# Patient Record
Sex: Female | Born: 1945 | Race: White | Hispanic: No | Marital: Married | Smoking: Never smoker
Health system: Southern US, Community
[De-identification: ages and names within clinical notes are randomized; demographics above are authoritative.]

---

## 2021-11-22 ENCOUNTER — Other Ambulatory Visit: Payer: Self-pay

## 2021-11-22 ENCOUNTER — Emergency Department (HOSPITAL_BASED_OUTPATIENT_CLINIC_OR_DEPARTMENT_OTHER)
Admission: EM | Admit: 2021-11-22 | Discharge: 2021-11-22 | Disposition: A | Discharge status: Home / Self Care | Payer: Medicare (Managed Care) | Attending: Emergency Medicine | Admitting: Emergency Medicine

## 2021-11-22 ENCOUNTER — Encounter (HOSPITAL_BASED_OUTPATIENT_CLINIC_OR_DEPARTMENT_OTHER): Payer: Self-pay

## 2021-11-22 ENCOUNTER — Emergency Department (HOSPITAL_BASED_OUTPATIENT_CLINIC_OR_DEPARTMENT_OTHER): Payer: Medicare (Managed Care) | Admitting: Radiology

## 2021-11-22 DIAGNOSIS — I4891 Unspecified atrial fibrillation: Secondary | ICD-10-CM | POA: Insufficient documentation

## 2021-11-22 DIAGNOSIS — R7989 Other specified abnormal findings of blood chemistry: Secondary | ICD-10-CM | POA: Diagnosis not present

## 2021-11-22 DIAGNOSIS — I10 Essential (primary) hypertension: Secondary | ICD-10-CM | POA: Diagnosis not present

## 2021-11-22 DIAGNOSIS — Z79899 Other long term (current) drug therapy: Secondary | ICD-10-CM | POA: Insufficient documentation

## 2021-11-22 DIAGNOSIS — Z7901 Long term (current) use of anticoagulants: Secondary | ICD-10-CM | POA: Insufficient documentation

## 2021-11-22 DIAGNOSIS — E039 Hypothyroidism, unspecified: Secondary | ICD-10-CM | POA: Insufficient documentation

## 2021-11-22 DIAGNOSIS — R002 Palpitations: Secondary | ICD-10-CM | POA: Diagnosis present

## 2021-11-22 LAB — BASIC METABOLIC PANEL
Anion gap: 10 (ref 5–15)
BUN: 16 mg/dL (ref 8–23)
CO2: 26 mmol/L (ref 22–32)
Calcium: 10.3 mg/dL (ref 8.9–10.3)
Chloride: 101 mmol/L (ref 98–111)
Creatinine, Ser: 1.09 mg/dL — ABNORMAL HIGH (ref 0.44–1.00)
GFR, Estimated: 53 mL/min — ABNORMAL LOW (ref >60)
Glucose, Bld: 108 mg/dL — ABNORMAL HIGH (ref 70–99)
Potassium: 3.7 mmol/L (ref 3.5–5.1)
Sodium: 137 mmol/L (ref 135–145)

## 2021-11-22 LAB — BRAIN NATRIURETIC PEPTIDE: B Natriuretic Peptide: 228.5 pg/mL — ABNORMAL HIGH (ref 0.0–100.0)

## 2021-11-22 LAB — TSH: TSH: 1.338 u[IU]/mL (ref 0.350–4.500)

## 2021-11-22 LAB — TROPONIN I (HIGH SENSITIVITY): Troponin I (High Sensitivity): 3 ng/L (ref <18)

## 2021-11-22 LAB — CBC
HCT: 41.3 % (ref 36.0–46.0)
Hemoglobin: 13.6 g/dL (ref 12.0–15.0)
MCH: 26.8 pg (ref 26.0–34.0)
MCHC: 32.9 g/dL (ref 30.0–36.0)
MCV: 81.5 fL (ref 80.0–100.0)
Platelets: 285 10*3/uL (ref 150–400)
RBC: 5.07 MIL/uL (ref 3.87–5.11)
RDW: 14.5 % (ref 11.5–15.5)
WBC: 6.5 10*3/uL (ref 4.0–10.5)
nRBC: 0 % (ref 0.0–0.2)

## 2021-11-22 LAB — T4, FREE: Free T4: 1.28 ng/dL — ABNORMAL HIGH (ref 0.61–1.12)

## 2021-11-22 LAB — MAGNESIUM: Magnesium: 2 mg/dL (ref 1.7–2.4)

## 2021-11-22 LAB — PROTIME-INR
INR: 1 (ref 0.8–1.2)
Prothrombin Time: 13 seconds (ref 11.4–15.2)

## 2021-11-22 MED ORDER — APIXABAN 5 MG PO TABS: 5.0000 mg | ORAL_TABLET | Freq: Two times a day (BID) | ORAL | 1 refills | Status: AC

## 2021-11-22 MED ORDER — APIXABAN (ELIQUIS) EDUCATION KIT FOR DVT/PE PATIENTS: PACK | Freq: Once | Status: AC

## 2021-11-22 MED ORDER — APIXABAN 2.5 MG PO TABS
5.0000 mg | ORAL_TABLET | Freq: Two times a day (BID) | ORAL | Status: DC
  Administered 2021-11-22: 5 mg via ORAL
  Filled 2021-11-22: qty 2

## 2021-11-22 NOTE — ED Provider Notes (Signed)
[facility] EMERGENCY DEPT Provider Note   CSN: [id number] Arrival date & time: 11/22/21  1537     History  Chief Complaint  Patient presents with   Irregular Heart Beat    Tanya Michael is a 76 y.o. female.  HPI  Patient presents with abnormal rhythm.  Patient states that her apple watch alerted her that she was in atrial fibrillation, no history of it.  States she does intermittently have palpitations and feel dizzy, she has had 3 episodes of this in the prior few months.  Denies any syncope, no history of MI or strokes.  Denies any chest pain or feeling short of breath.  Patient usually lives in [place], she and her husband spend the winter in [place] visiting family.    Patient with history of hypertension, hypothyroid and hyperlipidemia.   No history of intracranial bleed, TIA, stroke, major bleeding, predisposition to bleeding, renal disease, liver disease.  No history of epidermal or subdural hematoma.  No history of PE or MI.  Home Medications Prior to Admission medications   Medication Sig Start Date End Date Taking? Authorizing Provider  amLODipine (NORVASC) 10 MG tablet Take 10 mg by mouth daily.   Yes [provider]  apixaban (ELIQUIS) 5 MG TABS tablet Take 1 tablet (5 mg total) by mouth 2 (two) times daily. 11/22/21 01/21/22 Yes Sherrill Raring, PA-C  atorvastatin (LIPITOR) 20 MG tablet Take 20 mg by mouth daily.   Yes [provider]  buPROPion (WELLBUTRIN XL) 300 MG 24 hr tablet Take 300 mg by mouth daily.   Yes [provider]  DULoxetine (CYMBALTA) 30 MG capsule Take 30 mg by mouth daily. Takes 3 tablets for total 90 mg   Yes [provider]  DULoxetine HCl 30 MG CSDR Take by mouth daily.   Yes [provider]  levothyroxine (SYNTHROID) 125 MCG tablet Take 125 mcg by mouth daily before breakfast.   Yes [provider]  losartan-hydrochlorothiazide (HYZAAR) 100-25 MG tablet Take 1 tablet by mouth  daily.   Yes [provider]  metoprolol succinate (TOPROL-XL) 25 MG 24 hr tablet Take 25 mg by mouth daily.   Yes [provider]  traZODone (DESYREL) 100 MG tablet Take 100 mg by mouth at bedtime.   Yes [provider]      Allergies    Phenobarbital and Sulfa antibiotics    Review of Systems   Review of Systems  Constitutional:  Negative for fever.  Cardiovascular:  Positive for palpitations.   Physical Exam Updated Vital Signs BP (!) 153/82 (BP Location: Left Arm)    Pulse 81    Temp 97.7 F (36.5 C) (Oral)    Resp 16    Ht $R'5\' 2"'tK$  (1.575 m)    Wt 78.9 kg    SpO2 96%    BMI 31.83 kg/m  Physical Exam Vitals and nursing note reviewed. Exam conducted with a chaperone present.  Constitutional:      Appearance: Normal appearance.  HENT:     Head: Normocephalic and atraumatic.  Eyes:     General: No scleral icterus.       Right eye: No discharge.        Left eye: No discharge.     Extraocular Movements: Extraocular movements intact.     Pupils: Pupils are equal, round, and reactive to light.  Cardiovascular:     Rate and Rhythm: Tachycardia present. Rhythm irregular.     Pulses: Normal pulses.  Heart sounds: Normal heart sounds. No murmur heard.   No friction rub. No gallop.  Pulmonary:     Effort: Pulmonary effort is normal. No respiratory distress.     Breath sounds: Normal breath sounds.  Abdominal:     General: Abdomen is flat. Bowel sounds are normal. There is no distension.     Palpations: Abdomen is soft.     Tenderness: There is no abdominal tenderness.  Skin:    General: Skin is warm and dry.     Coloration: Skin is not jaundiced.  Neurological:     Mental Status: She is alert. Mental status is at baseline.     Coordination: Coordination normal.    ED Results / Procedures / Treatments   Labs (all labs ordered are listed, but only abnormal results are displayed) Labs Reviewed  BASIC METABOLIC PANEL - Abnormal; Notable for the  following components:      Result Value   Glucose, Bld 108 (*)    Creatinine, Ser 1.09 (*)    GFR, Estimated 53 (*)    All other components within normal limits  BRAIN NATRIURETIC PEPTIDE - Abnormal; Notable for the following components:   B Natriuretic Peptide 228.5 (*)    All other components within normal limits  CBC  PROTIME-INR  MAGNESIUM  TSH  T4, FREE  TROPONIN I (HIGH SENSITIVITY)    EKG EKG Interpretation  Date/Time:  Thursday November 22 2021 15:59:46 EST Ventricular Rate:  95 PR Interval:    QRS Duration: 82 QT Interval:  372 QTC Calculation: 467 R Axis:   42 Text Interpretation: Atrial fibrillation Nonspecific ST abnormality Abnormal ECG No previous ECGs available Confirmed by Gareth Morgan [phone]) on 11/22/2021 5:18:38 PM  Radiology DG Chest 2 View  Result Date: 11/22/2021 CLINICAL DATA:  Atrial fibrillation in a 76 year old female. EXAM: CHEST - 2 VIEW COMPARISON:  None FINDINGS: The heart size and mediastinal contours are within normal limits. Both lungs are clear. The visualized skeletal structures are unremarkable. IMPRESSION: No active cardiopulmonary disease. Electronically Signed   By: Zetta Bills M.D.   On: 11/22/2021 16:17    Procedures Procedures    Medications Ordered in ED Medications  apixaban (ELIQUIS) tablet 5 mg (5 mg Oral Given 11/22/21 2025)  apixaban Arne Cleveland) Education Kit for DVT/PE patients ( Does not apply Given 11/22/21 2012)    ED Course/ Medical Decision Making/ A&P                           Medical Decision Making Amount and/or Complexity of Data Reviewed Labs: ordered. Radiology: ordered.  Risk Prescription drug management.   This patient presents to the ED for concern of palpitations/irregular heart rate, this involves an extensive number of treatment options, and is a complaint that carries with it a high risk of complications and morbidity.  The differential diagnosis includes arrhythmia, electrolyte imbalance,  thyroid disorder, ACS, CHF, other   Co morbidities that complicate the patient evaluation: HTN, hypothyroid, HLD.    Additional history obtained: -Additional history obtained from spouse at bedside -External records from outside source obtained and reviewed including: Chart review including previous notes, labs, imaging, consultation notes   Lab Tests: -I ordered, reviewed, and interpreted labs.  The pertinent results include:  Creatinine mildly elevated 1.09.  Troponin within normal limit, magnesium, PT/INR, within normal limit.  BNP slightly elevated but not excessively shows to be concerning for CHF.  No leukocytosis, no anemia.  TSH and T4  pending.   EKG -Atrial fibrillation, rate controlled likely secondary to her being on metoprolol for HTN   Imaging Studies ordered: -I ordered imaging studies including chest x-ray -I independently visualized and interpreted imaging which showed normal cardiac silhouette without any evidence of CHF, pneumonia, pleural effusion -I agree with the radiologist interpretation   Medicines ordered and prescription drug management: -I ordered medication including Eliquis for AF  -Reevaluation of the patient after these medicines showed that the patient stayed the same -I have reviewed the patients home medicines and have made adjustments as needed   Consultations Obtained: I requested consultation with the pharmacy,  and discussed lab and imaging findings as well as pertinent plan - they recommend: 5 mg of Eliquis twice daily.    ED Course: Patient is a 76 year old female presenting with atrial fibrillation.  She is relatively asymptomatic with intermittent palpitations over the last few months.  Not have any chest pain or shortness of breath, not in aVR.  Work-up does not show emergent etiology for the A. fib/electrolyte derangement, new CHF, MI.  Patient remains asymptomatic, no hypoxia.    ASSESSMENT AND PLAN: Paroxysmal Atrial Fibrillation  (ICD10:  I48.0) The patient's CHA2DS2-VASc score is 3 , indicating a  3.2% annual risk of stroke.   CHA2DS2-VASc Score =   3 (age, sex, HTN hx)     Signed,  Lahoma Crocker, PA-C    11/22/2021 8:48 PM     HAS BLED 2.   Critical Interventions: Initiation of anticoagulation   Cardiac Monitoring: The patient was maintained on a cardiac monitor.  I personally viewed and interpreted the cardiac monitored which showed an underlying rhythm of: Atrial fibrillation   Reevaluation: After the interventions noted above, I reevaluated the patient and found that they have :stayed the same   Dispostion: Patient initiated on anticoagulation and atrial fibrillation.  Questions answered, risks of being on anticoagulation discussed and questions answered.  Patient will follow up AF clinic.   Discussed HPI, physical exam and plan of care for this patient with attending Dr. Billy Fischer. The attending physician evaluated this patient as part of a shared visit and agrees with plan of care.           Final Clinical Impression(s) / ED Diagnoses Final diagnoses:  Atrial fibrillation, unspecified type [facility])    Rx / DC Orders ED Discharge Orders          Ordered    apixaban (ELIQUIS) 5 MG TABS tablet  2 times daily        11/22/21 2012              Sherrill Raring, Hershal Coria 11/22/21 2049    Gareth Morgan, MD 11/24/21 1245

## 2021-11-22 NOTE — ED Triage Notes (Signed)
Patient here POV from Home with Atrial Fibrillation.  Patient states her Apple Watch informed her she was in A. Fib. Patient went to UC and was instructed to come to ED for Evaluation.  Patient is Asymptomatic besides some Mild Dizziness.   Patient has not been diagnosed or informed of any Heart Irregularities.  NAD Noted during Triage. A&Ox4. GCS 15. Ambulatory.

## 2021-11-22 NOTE — Discharge Instructions (Addendum)
Take 5 mg of Eliquis twice daily. You will need to follow-up with the A. fib clinic, please call to set up an appointment.

## 2021-11-28 ENCOUNTER — Encounter (HOSPITAL_COMMUNITY): Payer: Self-pay | Admitting: Nurse Practitioner

## 2021-11-28 ENCOUNTER — Ambulatory Visit (HOSPITAL_COMMUNITY)
Admission: RE | Admit: 2021-11-28 | Discharge: 2021-11-28 | Disposition: A | Discharge status: Home / Self Care | Payer: No Typology Code available for payment source | Source: Ambulatory Visit | Attending: Nurse Practitioner | Admitting: Nurse Practitioner

## 2021-11-28 ENCOUNTER — Other Ambulatory Visit: Payer: Self-pay

## 2021-11-28 VITALS — BP 150/68 | HR 51 | Ht 62.0 in | Wt 178.0 lb

## 2021-11-28 DIAGNOSIS — I480 Paroxysmal atrial fibrillation: Secondary | ICD-10-CM | POA: Diagnosis present

## 2021-11-28 DIAGNOSIS — Z7901 Long term (current) use of anticoagulants: Secondary | ICD-10-CM | POA: Insufficient documentation

## 2021-11-28 DIAGNOSIS — Z79899 Other long term (current) drug therapy: Secondary | ICD-10-CM | POA: Insufficient documentation

## 2021-11-28 DIAGNOSIS — D6869 Other thrombophilia: Secondary | ICD-10-CM

## 2021-11-28 DIAGNOSIS — R0683 Snoring: Secondary | ICD-10-CM | POA: Insufficient documentation

## 2021-11-28 DIAGNOSIS — I10 Essential (primary) hypertension: Secondary | ICD-10-CM | POA: Insufficient documentation

## 2021-11-28 MED ORDER — DILTIAZEM HCL 30 MG PO TABS: ORAL_TABLET | ORAL | 1 refills | Status: AC

## 2021-11-28 NOTE — Progress Notes (Signed)
Primary Care Physician: Pcp, No Referring Physician:   [name] is a 76 y.o. female with a h/o HTN, previous dx of sleep apnea,  that presented to [facility] ER for palpations that was noted on her apple watch. EKG confirmed new onset afib. She was rate controlled as he was already on metoprolol for HTN.  She was started on eliquis for CHA2DS2VASc  of at least 3. She is from [place] and spends the winters here each year. She is planning go back to her home mid March.   Today, she denies symptoms of palpitations, chest pain, shortness of breath, orthopnea, PND, lower extremity edema, dizziness, presyncope, syncope, or neurologic sequela. The patient is tolerating medications without difficulties and is otherwise without complaint today.   No past medical history on file. No past surgical history on file.  Current Outpatient Medications  Medication Sig Dispense Refill   amLODipine (NORVASC) 10 MG tablet Take 10 mg by mouth daily.     apixaban (ELIQUIS) 5 MG TABS tablet Take 1 tablet (5 mg total) by mouth 2 (two) times daily. 60 tablet 1   atorvastatin (LIPITOR) 20 MG tablet Take 20 mg by mouth daily.     B Complex Vitamins (VITAMIN B COMPLEX) TABS Take 1 tablet by mouth daily.     buPROPion (WELLBUTRIN XL) 300 MG 24 hr tablet Take 300 mg by mouth daily.     cetirizine (ZYRTEC) 10 MG tablet Take 10 mg by mouth daily.     Cholecalciferol 50 MCG (2000 UT) CAPS Take 1 capsule by mouth daily.     diltiazem (CARDIZEM) 30 MG tablet Take 1 tablet every 4 hours AS NEEDED for heart rate >100 as long as BP >100. 30 tablet 1   DULoxetine (CYMBALTA) 30 MG capsule Take 90 mg by mouth daily. Takes 3 tablets for total 90 mg     fluticasone (FLONASE) 50 MCG/ACT nasal spray Place 1 spray into both nostrils as needed.     ibuprofen (ADVIL) 200 MG tablet Take 600 mg by mouth as needed.     levothyroxine (SYNTHROID) 125 MCG tablet Take 125 mcg by mouth daily before breakfast.      losartan-hydrochlorothiazide (HYZAAR) 100-25 MG tablet Take 1 tablet by mouth daily.     Magnesium 500 MG TABS Take 1 tablet by mouth daily.     metoprolol succinate (TOPROL-XL) 25 MG 24 hr tablet Take 25 mg by mouth daily.     omeprazole (PRILOSEC) 40 MG capsule Take 40 mg by mouth every morning.     traZODone (DESYREL) 100 MG tablet Take 100 mg by mouth at bedtime.     No current facility-administered medications for this encounter.    Allergies  Allergen Reactions   Gabapentin Other (See Comments)    Muscle jerking, swelling     Phenobarbital Hives   Sulfa Antibiotics Hives   Other Rash   Trimethoprim Rash    Social History   Socioeconomic History   Marital status: Married    Spouse name: Not on file   Number of children: Not on file   Years of education: Not on file   Highest education level: Not on file  Occupational History   Not on file  Tobacco Use   Smoking status: Never   Smokeless tobacco: Never  Substance and Sexual Activity   Alcohol use: Never   Drug use: Never   Sexual activity: Not on file  Other Topics Concern   Not on file  Social History Narrative  Not on file   Social Determinants of Health   Financial Resource Strain: Not on file  Food Insecurity: Not on file  Transportation Needs: Not on file  Physical Activity: Not on file  Stress: Not on file  Social Connections: Not on file  Intimate Partner Violence: Not on file    No family history on file.  ROS- All systems are reviewed and negative except as per the HPI above  Physical Exam: Vitals:   11/28/21 1015  BP: (!) 150/68  Pulse: (!) 51  Weight: 80.7 kg  Height: 5\' 2"  (1.575 m)   Wt Readings from Last 3 Encounters:  11/28/21 80.7 kg  11/22/21 78.9 kg    Labs: Lab Results  Component Value Date   NA 137 11/22/2021   K 3.7 11/22/2021   CL 101 11/22/2021   CO2 26 11/22/2021   GLUCOSE 108 (H) 11/22/2021   BUN 16 11/22/2021   CREATININE 1.09 (H) 11/22/2021   CALCIUM 10.3  11/22/2021   MG 2.0 11/22/2021   Lab Results  Component Value Date   INR 1.0 11/22/2021   No results found for: CHOL, HDL, LDLCALC, TRIG   GEN- The patient is well appearing, alert and oriented x 3 today.   Head- normocephalic, atraumatic Eyes-  Sclera clear, conjunctiva pink Ears- hearing intact Oropharynx- clear Neck- supple, no JVP Lymph- no cervical lymphadenopathy Lungs- Clear to ausculation bilaterally, normal work of breathing Heart- Regular rate and rhythm, no murmurs, rubs or gallops, PMI not laterally displaced GI- soft, NT, ND, + BS Extremities- no clubbing, cyanosis, or edema MS- no significant deformity or atrophy Skin- no rash or lesion Psych- euthymic mood, full affect Neuro- strength and sensation are intact  EKG-Vent. rate 51 BPM PR interval 210 ms QRS duration 84 ms QT/QTcB 474/436 ms P-R-T axes 73 57 43 Sinus bradycardia with 1st degree A-V block Otherwise normal ECG   Assessment and Plan:  1. New onset afib Documented on ER EKG, reviewed  It was rate controlled as pt is  on metoprolol ER 25 mg daily  She is in SR today  General education re afib Triggers discussed  Continue this dose as she has has a HR of 51 bpm at baseline so I don't think she can tolerate the higher dose for fear of profound bradycardia  I will RX cardizem 30 mg as needed for palpitations  Will order echo and inform pt of results   2. CHA2DS2VASc  score of at least 3 She will continue eliquis 5 mg bid  Bleeding precautions explained   3. H/o apnea States that she used to use cpap but her machine broke She snores less with 50 lb weight loss She is  scheduled to have another sleep study this spring on return to [place]  4. HTN Elevated here but controlled at home   She is encouraged to get established with cardiology on return to [facility] mid March    Tanya Michael C. Tanya Michael, [facility] [address] [place], [place] [address] [phone]

## 2021-11-28 NOTE — Patient Instructions (Signed)
Cardizem 30mg -- take 1 tablet every 4 hours AS NEEDED for heart rate >100 as long as top number of blood pressure >100.  

## 2021-12-06 ENCOUNTER — Other Ambulatory Visit: Payer: Self-pay

## 2021-12-06 ENCOUNTER — Ambulatory Visit (HOSPITAL_COMMUNITY)
Admission: RE | Admit: 2021-12-06 | Discharge: 2021-12-06 | Disposition: A | Discharge status: Home / Self Care | Payer: Medicare (Managed Care) | Source: Ambulatory Visit | Attending: Internal Medicine | Admitting: Internal Medicine

## 2021-12-06 DIAGNOSIS — I480 Paroxysmal atrial fibrillation: Secondary | ICD-10-CM | POA: Insufficient documentation

## 2021-12-06 DIAGNOSIS — I340 Nonrheumatic mitral (valve) insufficiency: Secondary | ICD-10-CM | POA: Insufficient documentation

## 2021-12-06 LAB — ECHOCARDIOGRAM COMPLETE
Area-P 1/2: 2.66 cm2
MV M vel: 5.08 m/s
MV Peak grad: 103 mmHg
Radius: 0.4 cm
S' Lateral: 3 cm

## 2021-12-07 ENCOUNTER — Encounter (HOSPITAL_COMMUNITY): Payer: Self-pay | Admitting: *Deleted

## 2022-12-05 ENCOUNTER — Encounter (HOSPITAL_COMMUNITY): Payer: Self-pay | Admitting: *Deleted

## 2023-02-17 IMAGING — DX DG CHEST 2V
2 series · 2 of 2 positions shown · non-contrast
Comparison: None

CLINICAL DATA: Atrial fibrillation in a 75-year-old female.

EXAM:
CHEST - 2 VIEW

[chest pa]
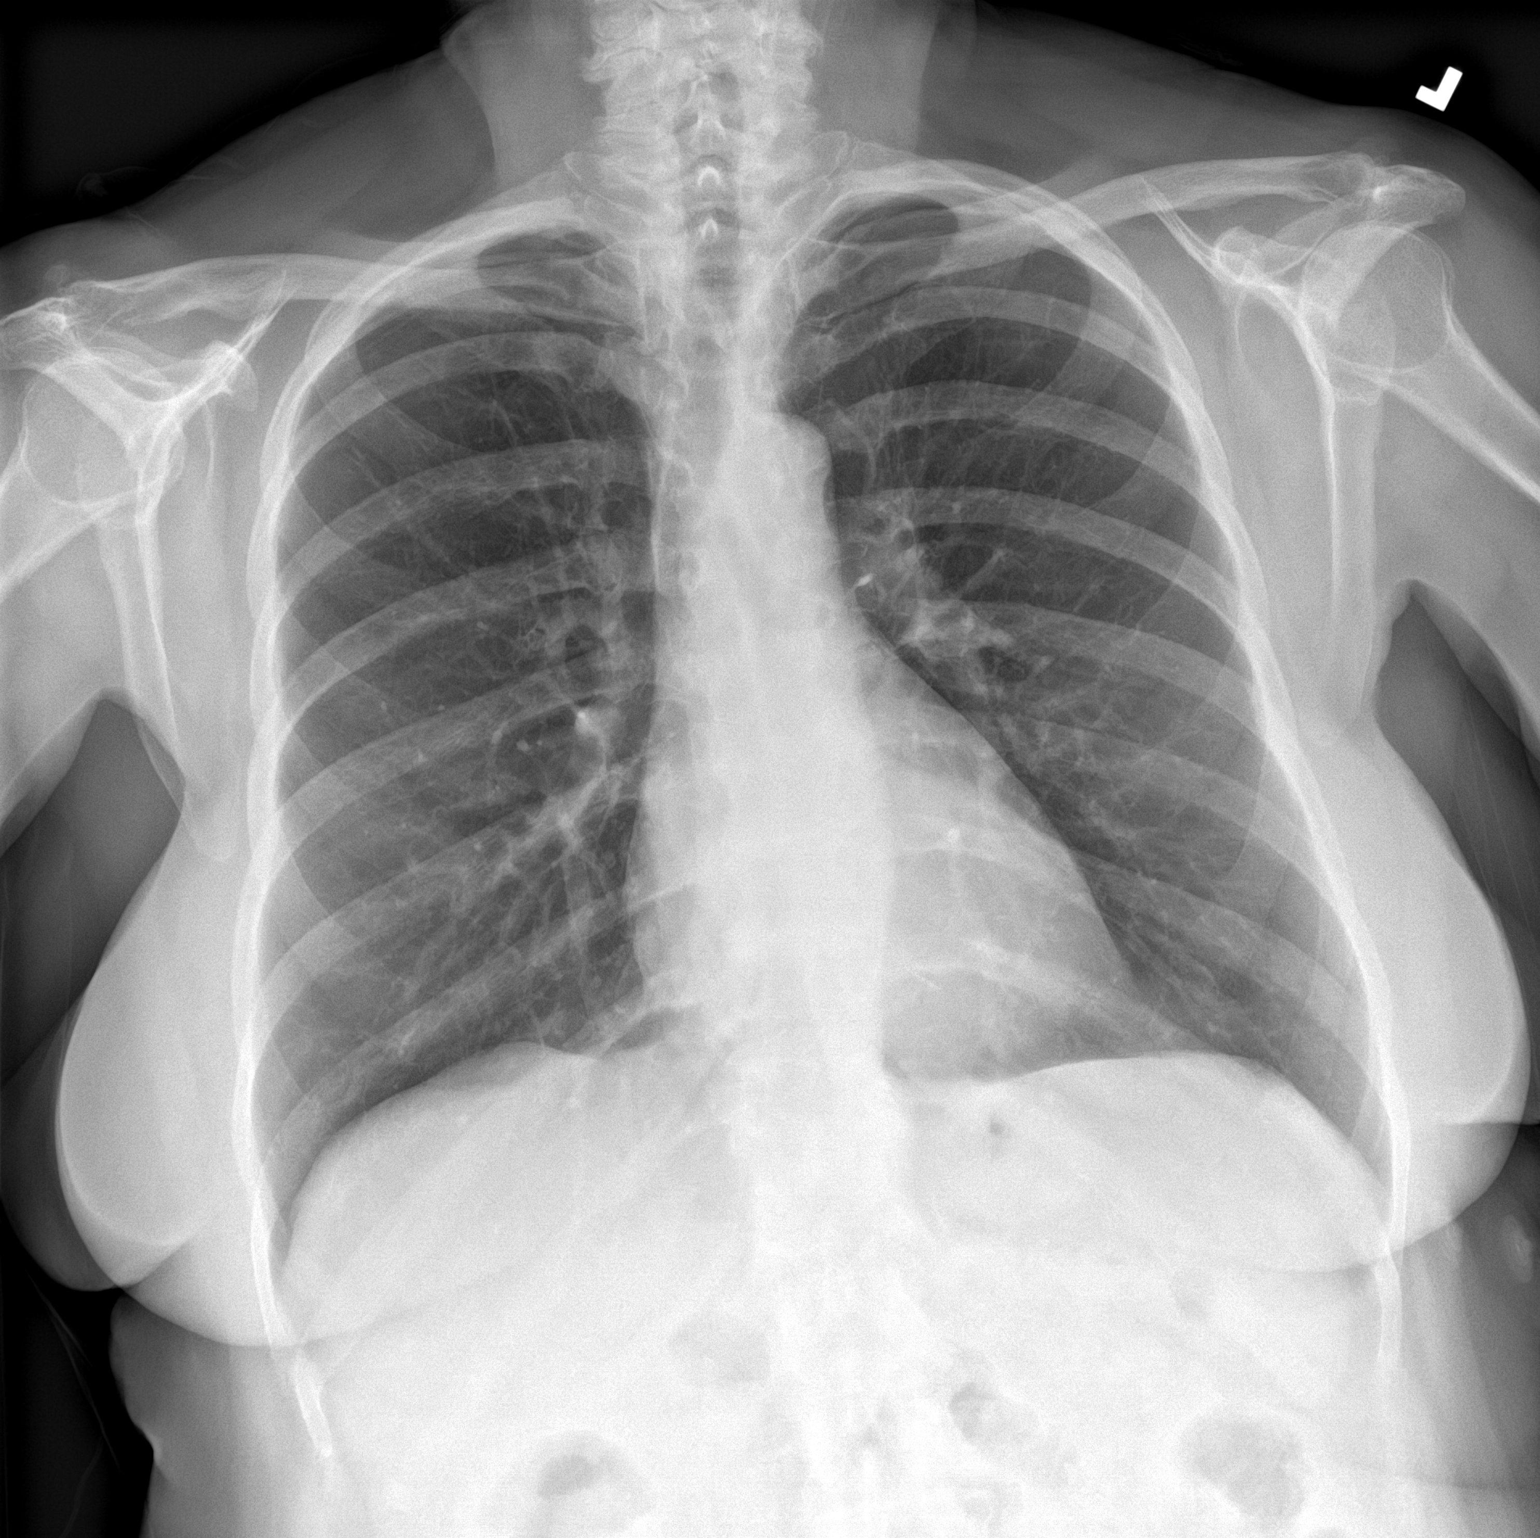

[chest lat]
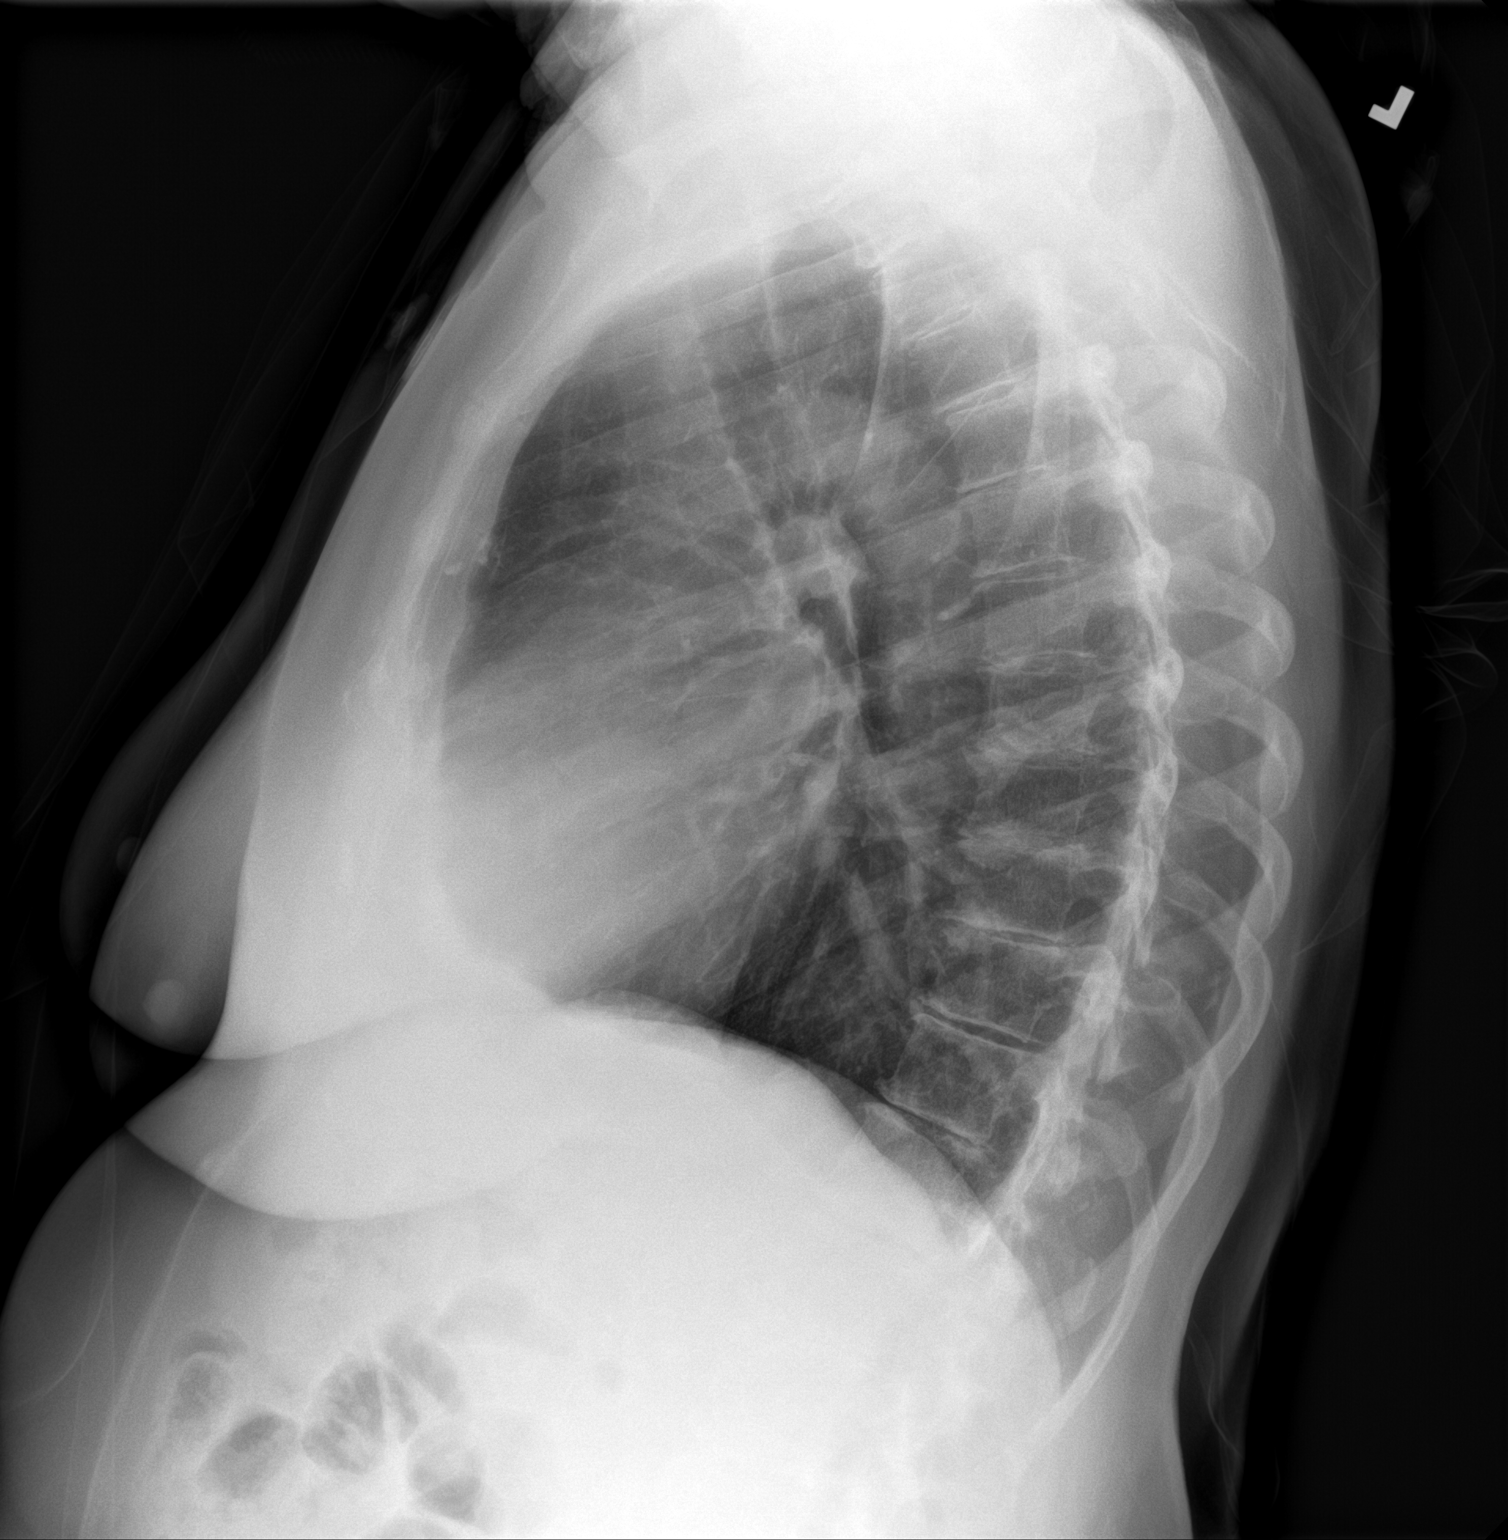

[2 of 2 positions shown; findings below may reference images not displayed]

FINDINGS: The heart size and mediastinal contours are within normal limits.
Both lungs are clear. The visualized skeletal structures are
unremarkable.
IMPRESSION: No active cardiopulmonary disease.
